# Patient Record
Sex: Female | Born: 1990 | Race: White | Hispanic: No | Marital: Single | Smoking: Never smoker
Health system: Southern US, Community
[De-identification: ages and names within clinical notes are randomized; demographics above are authoritative.]

## PROBLEM LIST (undated history)

## (undated) HISTORY — PX: WISDOM TOOTH EXTRACTION: SHX21

---

## 2013-12-11 ENCOUNTER — Encounter (INDEPENDENT_AMBULATORY_CARE_PROVIDER_SITE_OTHER): Payer: Self-pay

## 2013-12-11 ENCOUNTER — Encounter: Payer: Self-pay | Admitting: Family Medicine

## 2013-12-11 ENCOUNTER — Ambulatory Visit (INDEPENDENT_AMBULATORY_CARE_PROVIDER_SITE_OTHER): Payer: BC Managed Care – PPO | Admitting: Family Medicine

## 2013-12-11 VITALS — BP 110/82 | Temp 98.7°F | Ht 67.0 in | Wt 145.0 lb

## 2013-12-11 DIAGNOSIS — L989 Disorder of the skin and subcutaneous tissue, unspecified: Secondary | ICD-10-CM

## 2013-12-11 DIAGNOSIS — F518 Other sleep disorders not due to a substance or known physiological condition: Secondary | ICD-10-CM

## 2013-12-11 DIAGNOSIS — Z7189 Other specified counseling: Secondary | ICD-10-CM

## 2013-12-11 DIAGNOSIS — Z7689 Persons encountering health services in other specified circumstances: Secondary | ICD-10-CM

## 2013-12-11 DIAGNOSIS — IMO0002 Reserved for concepts with insufficient information to code with codable children: Secondary | ICD-10-CM

## 2013-12-11 NOTE — Progress Notes (Signed)
Pre visit review using our clinic review tool, if applicable. No additional management support is needed unless otherwise documented below in the visit note. 

## 2013-12-11 NOTE — Patient Instructions (Signed)
-  PLEASE SIGN UP FOR MYCHART TODAY   We recommend the following healthy lifestyle measures: - eat a healthy diet consisting of lots of vegetables, fruits, beans, nuts, seeds, healthy meats such as white chicken and fish and whole grains.  - avoid fried foods, fast food, processed foods, sodas, red meet and other fattening foods.  - get a least 150 minutes of aerobic exercise per week.   Follow up in: for CPE at you convenience and as needed

## 2013-12-11 NOTE — Progress Notes (Signed)
Chief Complaint  Patient presents with  . Establish Care  . mole concern  . dizzy spell    HPI:  Audrey Parker is here to establish care.  Last PCP and physical:  Has the following chronic problems and concerns today:  There are no active problems to display for this patient.  Dizzy spell: -very rarely for many years since she was a child, 1-2 times per year -  lasts for a few seconds and then feels fine - is triggered by a dream she had as a child and these images play in her mind and then she feels like she is dizzy -has not occurred in almost a year  Mole: -on abdomen -has had this as long as she can remember, have not changed -uncle had melanoma  Health Maintenance: -has never had pap smear  ROS: See pertinent positives and negatives per HPI.  History reviewed. No pertinent past medical history.  Family History  Problem Relation Age of Onset  . Cancer Maternal Uncle     melanoma  . COPD Maternal Grandfather   . Mental illness Paternal Grandmother     History   Social History  . Marital Status: Single    Spouse Name: N/A    Number of Children: N/A  . Years of Education: N/A   Social History Main Topics  . Smoking status: Never Smoker   . Smokeless tobacco: None  . Alcohol Use: No  . Drug Use: No  . Sexual Activity: No     Comment: denies ever being sexually active   Other Topics Concern  . 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  EXAM:  Filed Vitals:   12/11/13 0933  BP: 110/82  Temp: 98.7 F (37.1 C)    Body mass index is 22.71 kg/(m^2).  GENERAL: vitals reviewed and listed above, alert, oriented, appears well hydrated and in no acute distress  HEENT: atraumatic, conjunttiva clear, no obvious  abnormalities on inspection of external nose and ears  NECK: no obvious masses on inspection  LUNGS: clear to auscultation bilaterally, no wheezes, rales or rhonchi, good air movement  CV: HRRR, no peripheral edema  SKIN: SK on L abdomen  MS/NEURO: moves all extremities without noticeable abnormality Normal gait PERRLA CN II-XII grossly intact Finger to nose normal  PSYCH: pleasant and cooperative, no obvious depression or anxiety  ASSESSMENT AND PLAN:  Discussed the following assessment and plan:  Encounter to establish care  Abnormal dreams -has not occurred in abut 1 year and is less frequent, discussed etiologies and she wants to observe and if occurs again may see neurologist  Skin lesion Desires removal and she will call dermatologist for this given awkward location  -We reviewed the PMH, PSH, FH, SH, Meds and Allergies. -We provided refills for any medications we will prescribe as needed. -We addressed current concerns per orders and patient instructions. -We have asked for records for pertinent exams, studies, vaccines and notes from previous providers. -We have advised patient to follow up per instructions below.   -Patient advised to return or notify a doctor immediately if symptoms worsen or persist or new concerns arise.  Patient Instructions  -PLEASE  SIGN UP FOR MYCHART TODAY   We recommend the following healthy lifestyle measures: - eat a healthy diet consisting of lots of vegetables, fruits, beans, nuts, seeds, healthy meats such as white chicken and fish and whole grains.  - avoid fried foods, fast food, processed foods, sodas, red meet and other fattening foods.  - get a least 150 minutes of aerobic exercise per week.   Follow up in: for CPE at you convenience and as needed      Sabriah Hobbins, Jarrett Soho R.

## 2014-02-12 ENCOUNTER — Other Ambulatory Visit (HOSPITAL_COMMUNITY)
Admission: RE | Admit: 2014-02-12 | Discharge: 2014-02-12 | Disposition: A | Discharge status: Home / Self Care | Payer: BC Managed Care – PPO | Source: Ambulatory Visit | Attending: Family Medicine | Admitting: Family Medicine

## 2014-02-12 ENCOUNTER — Ambulatory Visit (INDEPENDENT_AMBULATORY_CARE_PROVIDER_SITE_OTHER): Payer: BC Managed Care – PPO | Admitting: Family Medicine

## 2014-02-12 ENCOUNTER — Encounter: Payer: Self-pay | Admitting: Family Medicine

## 2014-02-12 VITALS — BP 108/80 | Temp 99.1°F | Ht 67.0 in | Wt 144.0 lb

## 2014-02-12 DIAGNOSIS — Z01419 Encounter for gynecological examination (general) (routine) without abnormal findings: Secondary | ICD-10-CM | POA: Insufficient documentation

## 2014-02-12 DIAGNOSIS — Z23 Encounter for immunization: Secondary | ICD-10-CM

## 2014-02-12 DIAGNOSIS — Z0000 Encounter for general adult medical examination without abnormal findings: Secondary | ICD-10-CM

## 2014-02-12 NOTE — Progress Notes (Signed)
Chief Complaint  Patient presents with  . Annual Exam    HPI:  Here for CPE:  -Concerns today: none  -Diet: variety of foods, balance and well rounded  -Taking folic acid, Vit D, calcium: no  -Exercise: no regular exercise  -Diabetes and Dyslipidemia Screening: N/A  -Hx of HTN: no  -Vaccines: thinks had tetanus but not tdap 8 years ago  -pap history: never done  -FDLMP: 2 weeks ago, regular, normal  -sexual activity: no  -wants STI testing: no  -FH breast, colon or ovarian ca: see FH  -Alcohol, Tobacco, drug use: see social history  Review of Systems - Review of Systems  Constitutional: Negative for weight loss.  HENT: Negative for ear pain and hearing loss.   Eyes: Negative for blurred vision and double vision.  Respiratory: Negative for cough, shortness of breath and wheezing.   Cardiovascular: Negative for chest pain.  Gastrointestinal: Negative for vomiting, diarrhea, blood in stool and melena.  Genitourinary: Negative for dysuria and urgency.  Musculoskeletal: Negative for falls.  Skin: Negative for rash.  Neurological: Positive for headaches. Negative for dizziness, focal weakness and weakness.  Endo/Heme/Allergies: Does not bruise/bleed easily.  Psychiatric/Behavioral: Negative for depression and memory loss.   Migraines 1-2 times per year for many years, sometimes with aura.  No past medical history on file.  Past Surgical History  Procedure Laterality Date  . Wisdom tooth extraction      Family History  Problem Relation Age of Onset  . Cancer Maternal Uncle     melanoma  . COPD Maternal Grandfather   . Mental illness Paternal Grandmother     History   Social History  . Marital Status: Single    Spouse Name: N/A    Number of Children: N/A  . Years of Education: N/A   Social History Main Topics  . Smoking status: Never Smoker   . Smokeless tobacco: None  . Alcohol Use: No  . Drug Use: No  . Sexual Activity: No     Comment: denies  ever being sexually active   Other Topics Concern  . 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 moist mucus membranes.  NECK: supple, no masses or lymphadenopathy  LUNGS: clear to auscultation bilaterally, no rales, rhonchi or wheeze  CV: HRRR, no peripheral edema or cyanosis, normal pedal pulses  BREAST: normal appearance - no lesions or discharge, on palpation normal breast tissue without any suspicious masses  ABDOMEN: bowel sounds normal, soft, non tender to palpation, no masses, no rebound or guarding  GU: normal appearance of external genitalia - no lesions or masses, normal vaginal mucosa - no abnormal discharge, normal appearance of cervix - no lesions or abnormal discharge, she is very very tense and opening speculum was quite difficult.  RECTAL: declined  SKIN: no rash or abnormal lesions  MS:  normal gait, moves all extremities normally  NEURO: CN II-XII grossly intact, normal muscle strength and sensation to light touch on extremities  PSYCH: normal affect, pleasant and cooperative  ASSESSMENT AND PLAN:  Discussed the following assessment and plan:  Routine general medical examination at a health care facility - Plan: Cytology - PAP  Need for prophylactic vaccination with combined diphtheria-tetanus-pertussis (DTP) vaccine - Plan: Tdap vaccine greater than or equal to 7yo  IM  -Discussed and advised all [place] preventive services health task force level A and B recommendations for age, sex and risks.  -Advised at least 150 minutes of exercise per week and a healthy diet low in saturated fats and sweets and consisting of fresh fruits and vegetables, lean meats such as fish and white chicken and whole grains.  -Pap today  -tdap today  -labs, studies and vaccines per orders this encounter  Orders Placed This Encounter  Procedures  . Tdap vaccine greater than or equal to 7yo IM    Patient Instructions  -Vit D 1000 IU daily; 1200mg  total from diet and supplement; folic acid  -We have ordered labs or studies at this visit. It can take up to 1-2 weeks for results and processing. We will contact you with instructions IF your results are abnormal. Normal results will be released to your [facility]. If you have not heard from [place] or can not find your results in [facility] in 2 weeks please contact our office.  -let me know if you decide to see the neurologist about your migraines  -PLEASE SIGN UP FOR MYCHART TODAY   We recommend the following healthy lifestyle measures: - eat a healthy diet consisting of lots of vegetables, fruits, beans, nuts, seeds, healthy meats such as white chicken and fish and whole grains.  - avoid fried foods, fast food, processed foods, sodas, red meet and other fattening foods.  - get a least 150 minutes of aerobic exercise per week.   Follow up in: 1 year or as needed     Patient advised to return to clinic immediately if symptoms worsen or persist or new concerns.   Return in about 1 year (around 02/13/2015), or if symptoms worsen or fail to improve.  [name].

## 2014-02-12 NOTE — Patient Instructions (Signed)
-  Vit D 1000 IU daily; 1200mg  total from diet and supplement; folic acid  -We have ordered labs or studies at this visit. It can take up to 1-2 weeks for results and processing. We will contact you with instructions IF your results are abnormal. Normal results will be released to your [facility]. If you have not heard from [place] or can not find your results in [facility] in 2 weeks please contact our office.  -let me know if you decide to see the neurologist about your migraines  -PLEASE SIGN UP FOR MYCHART TODAY   We recommend the following healthy lifestyle measures: - eat a healthy diet consisting of lots of vegetables, fruits, beans, nuts, seeds, healthy meats such as white chicken and fish and whole grains.  - avoid fried foods, fast food, processed foods, sodas, red meet and other fattening foods.  - get a least 150 minutes of aerobic exercise per week.   Follow up in: 1 year or as needed

## 2014-02-12 NOTE — Progress Notes (Signed)
Pre visit review using our clinic review tool, if applicable. No additional management support is needed unless otherwise documented below in the visit note. 

## 2017-08-04 ENCOUNTER — Encounter: Payer: Self-pay | Admitting: Family Medicine

## 2017-11-24 ENCOUNTER — Encounter: Payer: Self-pay | Admitting: Family Medicine

## 2020-12-02 ENCOUNTER — Ambulatory Visit: Payer: Self-pay | Admitting: Obstetrics & Gynecology

## 2020-12-17 ENCOUNTER — Ambulatory Visit (INDEPENDENT_AMBULATORY_CARE_PROVIDER_SITE_OTHER): Payer: 59 | Admitting: Obstetrics & Gynecology

## 2020-12-17 ENCOUNTER — Other Ambulatory Visit: Payer: Self-pay

## 2020-12-17 ENCOUNTER — Encounter: Payer: Self-pay | Admitting: Obstetrics & Gynecology

## 2020-12-17 ENCOUNTER — Other Ambulatory Visit (HOSPITAL_COMMUNITY)
Admission: RE | Admit: 2020-12-17 | Discharge: 2020-12-17 | Disposition: A | Discharge status: Home / Self Care | Payer: 59 | Source: Ambulatory Visit | Attending: Obstetrics & Gynecology | Admitting: Obstetrics & Gynecology

## 2020-12-17 VITALS — BP 124/90 | HR 108 | Ht 67.0 in | Wt 148.0 lb

## 2020-12-17 DIAGNOSIS — Z01419 Encounter for gynecological examination (general) (routine) without abnormal findings: Secondary | ICD-10-CM | POA: Insufficient documentation

## 2020-12-17 DIAGNOSIS — N852 Hypertrophy of uterus: Secondary | ICD-10-CM

## 2020-12-17 NOTE — Progress Notes (Signed)
Patient ID: Audrey Parker, female   DOB: [birth year], 30 y.o.   MRN: [id number]  Chief Complaint  Patient presents with  . New Patient (Initial Visit)  check for fibroids  HPI Triston A Frater is a 30 y.o. female.  G0P0000 Patient's last menstrual period was 11/30/2020. Patient has never had sex. She is referred to evaluate lower abdominal bloating and cramps and some heavy flow with menses. She says her Dr. Suspected fibroids when she was examined in October. Last pap 5-6 yr ago.  HPI  History reviewed. No pertinent past medical history.  Past Surgical History:  Procedure Laterality Date  . WISDOM TOOTH EXTRACTION      Family History  Problem Relation Age of Onset  . Cancer Maternal Uncle        melanoma  . COPD Maternal Grandfather   . Mental illness Paternal Grandmother     Social History Social History   Tobacco Use  . Smoking status: Never Smoker  . Smokeless tobacco: Never Used  Substance Use Topics  . Alcohol use: No  . Drug use: No    No Known Allergies  Current Outpatient Medications  Medication Sig Dispense Refill  . Pediatric Multivit-Minerals-C (FLINTSTONES COMPLETE PO) Take by mouth daily.     No current facility-administered medications for this visit.    Review of Systems Review of Systems  Constitutional: Negative.   Respiratory: Negative.   Gastrointestinal: Positive for abdominal distention (at menses), constipation and nausea (associated with menses).  Genitourinary: Positive for menstrual problem and pelvic pain (cramps). Negative for dysuria.    Blood pressure 124/90, pulse (!) 108, height 5\' 7"  (1.702 m), weight 148 lb (67.1 kg), last menstrual period 11/30/2020.  Physical Exam Physical Exam Vitals and nursing note reviewed. Exam conducted with a chaperone present.  Constitutional:      Appearance: Normal appearance.  Cardiovascular:     Rate and Rhythm: Normal rate.  Pulmonary:     Effort: Pulmonary effort is normal.  Chest:   Breasts:     Tanner Score is 5.     Right: Normal. No mass or axillary adenopathy.     Left: Normal. No mass or axillary adenopathy.    Abdominal:     Palpations: Abdomen is soft. There is mass (low midline).  Genitourinary:    General: Normal vulva.     Vagina: Normal.     Cervix: Normal.     Uterus: Enlarged (12-14 weeks).      Adnexa: Right adnexa normal and left adnexa normal.  Lymphadenopathy:     Upper Body:     Right upper body: No axillary adenopathy.     Left upper body: No axillary adenopathy.  Neurological:     Mental Status: She is alert.     Data Reviewed Pap 2015  Assessment Enlarged uterus - Plan: US PELVIC COMPLETE WITH TRANSVAGINAL  Well woman exam with routine gynecological exam - Plan: Cytology - PAP( [facility])    Plan Orders Placed This Encounter  Procedures  . US PELVIC COMPLETE WITH TRANSVAGINAL    Standing Status:   Future    Standing Expiration Date:   12/17/2021    Order Specific Question:   Reason for Exam (SYMPTOM  OR DIAGNOSIS REQUIRED)    Answer:   enlarged uterus    Order Specific Question:   Preferred imaging location?    Answer:   [facility]    Order Specific Question:   Release to patient    Answer:  Immediate   Will f/u after [place] Take naproxen for menstrual sx    Emeterio Reeve 12/17/2020, 9:35 AM

## 2020-12-17 NOTE — Patient Instructions (Signed)
Dysmenorrhea Dysmenorrhea means cramps during your period (menstrual period) that cause pain in your lower belly (abdomen). The pain is caused by the tightening (contracting) of the muscles of the womb (uterus). The pain may be mild or very bad. Primary dysmenorrhea is cramps that last a couple of days when a woman starts having periods or soon after. As a woman gets older or has a baby, the cramps will usually lessen or disappear. Secondary dysmenorrhea begins later in life and is caused by other problems. What are the causes? This condition may be caused by problems with the:  Tissue that lines the womb. This tissue may grow: ? Outside of the womb. ? Into the walls of the womb.  Blood vessels in the area between your hip bones (pelvis).  Tissue in the lower part of the womb (cervix), including growths (polyps).  Muscles that hold up the womb.  Bladder.  Bowels. It can also be caused by cancer. Other causes include:  A very tipped womb.  The lower part of the womb having a small opening.  Tumors in the womb that are not cancer.  Pelvic inflammatory disease (PID).  Scars from surgeries you have had.  A cyst in the ovaries.  An IUD (intrauterine device). What increases the risk?  Being younger than age 82.  Having started puberty early.  Having irregular bleeding or heavy bleeding.  Never having given birth.  Having a family history of period cramps.  Smoking or using products with nicotine.  Having a high body weight or a low body weight. What are the signs or symptoms?  Cramps and pain in the lower belly or lower back.  A feeling of fullness in the lower belly.  Periods lasting for longer than 7 days.  Headaches.  Bloating.  Tiredness (fatigue).  Feeling like you may vomit (nauseous) or vomiting.  Watery poop (diarrhea) or loose poop (stool).  Sweating.  Dizziness. How is this treated? Treatment depends on the cause of the cramps. Treatment may  include medicines, such as:  Medicines for pain.  Medicines for bleeding.  Body chemical (hormone) replacement therapy. ? Shots (injections) to stop the menstrual period. ? Birth control pills. ? An IUD.  NSAIDs, such as ibuprofen. Other treatments may include:  Surgeries.  Procedures.  Nerve stimulation.  Doing exercises.  Yoga and alternative treatments. Work with your doctor to find what treatments are best for you. Follow these instructions at home: Helping pain and cramping  If told, put heat on your lower back or belly when you have pain or cramps. Do this as often as told by your doctor. Use the heat source that your doctor recommends, such as a moist heat pack or a heating pad. ? Place a towel between your skin and the heat. ? Leave the heat on for 20-30 minutes. ? Take off the heat if your skin turns bright red. This is very important. If you cannot feel pain, heat, or cold, you have a greater risk of getting burned.  Do not sleep with a heating pad.  Exercise. Walking, swimming, or biking can help take away cramps.  Massage your lower back or belly. This may help lessen pain.   General instructions  Take over-the-counter and prescription medicines only as told by your doctor.  Ask your doctor if you should avoid driving or using machines while you are taking your medicine.  Avoid alcohol and caffeine during and right before your period. These can make cramps worse.  Do not  smoke or use any products that contain nicotine or tobacco. If you need help quitting, ask your doctor.  Keep all follow-up visits. Contact a doctor if:  You have pain that gets worse.  You have pain that does not get better with medicine.  You have pain during sex.  You feel like you may vomit or you vomit during your period and medicine does not help. Get help right away if:  You faint. Summary  Dysmenorrhea means painful cramps during your period.  Put heat on your lower  back or belly when you have pain or cramps.  Do exercises like walking, swimming, or biking to help with cramps.  Contact a doctor if you have pain during sex. This information is not intended to replace advice given to you by your health care provider. Make sure you discuss any questions you have with your health care provider. Document Revised: 06/18/2020 Document Reviewed: 06/18/2020 Elsevier Patient Education  2021 Elsevier Inc.  

## 2020-12-17 NOTE — Progress Notes (Signed)
Pt states it has been several yeas since she has had an gyn exam/pap.  Pt states she has history of fibroids and has some occ problems.

## 2020-12-19 LAB — CYTOLOGY - PAP
Adequacy: ABSENT
Diagnosis: NEGATIVE

## 2020-12-26 ENCOUNTER — Ambulatory Visit
Admission: RE | Admit: 2020-12-26 | Discharge: 2020-12-26 | Disposition: A | Discharge status: Home / Self Care | Payer: 59 | Source: Ambulatory Visit | Attending: Obstetrics & Gynecology | Admitting: Obstetrics & Gynecology

## 2020-12-26 DIAGNOSIS — N852 Hypertrophy of uterus: Secondary | ICD-10-CM

## 2020-12-29 ENCOUNTER — Other Ambulatory Visit: Payer: 59

## 2021-01-13 ENCOUNTER — Telehealth (INDEPENDENT_AMBULATORY_CARE_PROVIDER_SITE_OTHER): Payer: 59 | Admitting: Obstetrics & Gynecology

## 2021-01-13 DIAGNOSIS — D251 Intramural leiomyoma of uterus: Secondary | ICD-10-CM

## 2021-01-13 DIAGNOSIS — N852 Hypertrophy of uterus: Secondary | ICD-10-CM

## 2021-01-13 DIAGNOSIS — D259 Leiomyoma of uterus, unspecified: Secondary | ICD-10-CM | POA: Insufficient documentation

## 2021-01-13 NOTE — Progress Notes (Signed)
[facility] for [place] results.  Reports no problems today.

## 2021-01-13 NOTE — Progress Notes (Signed)
TELEHEALTH GYNECOLOGY VISIT ENCOUNTER NOTE  Provider location: [facility] [address]   I connected with Inez Catalina on 01/13/21 at  4:15 PM EST by telephone at home and verified that I am speaking with the correct person using two identifiers. Patient was unable to do MyChart audiovisual encounter due to technical difficulties, she tried several times.    I discussed the limitations, risks, security and privacy concerns of performing an evaluation and management service by telephone and the availability of in person appointments. I also discussed with the patient that there may be a patient responsible charge related to this service. The patient expressed understanding and agreed to proceed.   History:  ALONDA WEABER is a 30 y.o. G0P0000 female being evaluated today for enlarged fibroid which was confirmed to be due to a large dominant fibroid with [place] 12/26/20. She denies any abnormal vaginal discharge, bleeding, pelvic pain or other concerns.  Her last period was spotting only     No past medical history on file. Past Surgical History:  Procedure Laterality Date  . WISDOM TOOTH EXTRACTION     The following portions of the patient's history were reviewed and updated as appropriate: allergies, current medications, past family history, past medical history, past social history, past surgical history and problem list.   Health Maintenance:  Normal pap  12/17/20.    Review of Systems:  Pertinent items noted in HPI and remainder of comprehensive ROS otherwise negative.  Physical Exam:   General:  Alert, oriented and cooperative.   Mental Status: Normal mood and affect perceived. Normal judgment and thought content.  Physical exam deferred due to nature of the encounter  Labs and Imaging No results found for this or any previous visit (from the past 336 hour(s)). US PELVIC COMPLETE WITH TRANSVAGINAL  Result Date: 12/26/2020 CLINICAL DATA:  Uterine enlargement,  pelvic cramping, heavy menstrual bleeding. LMP 11/30/2020 EXAM: TRANSABDOMINAL AND TRANSVAGINAL ULTRASOUND OF PELVIS TECHNIQUE: Both transabdominal and transvaginal ultrasound examinations of the pelvis were performed. Transabdominal technique was performed for global imaging of the pelvis including uterus, ovaries, adnexal regions, and pelvic cul-de-sac. It was necessary to proceed with endovaginal exam following the transabdominal exam to visualize the endometrium. COMPARISON:  None FINDINGS: Uterus Measurements: 19 x 12 x 16 cm = volume: 1,894 mL. The uterus demonstrates relatively globular enlargement of the fundus. The myometrial echotexture is diffusely markedly, coarsened. Cine images deviation of the endometrial stripe laterally with the myometrial abnormality largely related to a single large intramural heterogeneously hypoechoic mass most in keeping with a dominant intramural fibroid measuring roughly 14.5 x 8.1 x 12.6 cm in size. Multiple nabothian cysts are seen within the cervix which is otherwise unremarkable though not specifically imaged on this examination. Endometrium Thickness: Best seen on transvaginal imaging measuring 8 mm in thickness. No focal abnormality visualized. Right ovary Not visualized.  No adnexal masses seen. Left ovary Measurements: 2.9 x 1.8 x 4.2 cm = volume: 12 mL. Normal appearance/no adnexal mass. Other findings No abnormal free fluid. IMPRESSION: Moderate uterine enlargement related to a dominant uterine fibroid measuring roughly 14.5 cm in greatest dimension. This could be better assessed with MRI examination, particularly if uterine artery embolization is to be considered as a therapeutic option. Nonvisualization of the right ovary Electronically Signed   By: Fidela Salisbury MD   On: 12/26/2020 15:31      Assessment and Plan:     1. Enlarged uterus Fibroid uterus with sx of pelvic mass.  She may be a candidate for [place] and we discussed this vs.surgical option. She will  consider whether to be referred to IR.       I discussed the assessment and treatment plan with the patient. The patient was provided an opportunity to ask questions and all were answered. The patient agreed with the plan and demonstrated an understanding of the instructions.   The patient was advised to call back or seek an in-person evaluation/go to the ED if the symptoms worsen or if the condition fails to improve as anticipated.  I provided 15 minutes of non-face-to-face time during this encounter.   Emeterio Reeve, MD [facility] [place], [place]

## 2021-01-13 NOTE — Patient Instructions (Signed)
Uterine Artery Embolization for Fibroids  Uterine artery embolization is a procedure to shrink uterine fibroids. Uterine fibroids are masses of tissue (tumors) that can develop in the womb (uterus). They are also called leiomyomas. This type of tumor is not cancerous (benign) and does not spread to other parts of the body outside of the pelvic area. The pelvic area is the part of the body between the hip bones. You can have one or many fibroids. Fibroids can vary in size, shape, weight, and where they grow in the uterus. Some can become quite large. In this procedure, a thin plastic tube (catheter) is used to inject a chemical that blocks off the blood supply to the fibroid, which causes the fibroid to shrink. Tell a health care provider about:  Any allergies you have.  All medicines you are taking, including vitamins, herbs, eye drops, creams, and over-the-counter medicines.  Any problems you or family members have had with anesthetic medicines.  Any blood disorders you have.  Any surgeries you have had.  Any medical conditions you have.  Whether you are pregnant or may be pregnant. What are the risks? Generally, this is a safe procedure. However, problems may occur, including:  Bleeding.  Allergic reactions to medicines or dyes.  Damage to other structures or organs.  Infection, including blood infection (septicemia).  Injury to the uterus from decreased blood supply.  Lack of menstrual periods (amenorrhea).  Death of tissue cells (necrosis) around your bladder or vulva.  Development of a hole between organs or from an organ to the surface of your skin (fistula).  Blood clot in the legs (deep vein thrombosis) or lung (pulmonary embolus).  Nausea and vomiting. What happens before the procedure? Staying hydrated Follow instructions from your health care provider about hydration, which may include:  Up to 2 hours before the procedure - you may continue to drink clear  liquids, such as water, clear fruit juice, black coffee, and plain tea. Eating and drinking restrictions Follow instructions from your health care provider about eating and drinking, which may include:  8 hours before the procedure - stop eating heavy meals or foods such as meat, fried foods, or fatty foods.  6 hours before the procedure - stop eating light meals or foods, such as toast or cereal.  6 hours before the procedure - stop drinking milk or drinks that contain milk.  2 hours before the procedure - stop drinking clear liquids. Medicines  Ask your health care provider about: ? Changing or stopping your regular medicines. This is especially important if you are taking diabetes medicines or blood thinners. ? Taking over-the-counter medicines, vitamins, herbs, and supplements. ? Taking medicines such as aspirin and ibuprofen. These medicines can thin your blood. Do not take these medicines unless your health care provider tells you to take them.  You may be given antibiotic medicine to help prevent infection.  You may be given medicine to prevent nausea and vomiting (antiemetic). General instructions  Ask your health care provider how your surgical site will be marked or identified.  You may be asked to shower with a germ-killing soap.  Plan to have someone take you home from the hospital or clinic.  If you will be going home right after the procedure, plan to have someone with you for 24 hours.  You will be asked to empty your bladder. What happens during the procedure?  To lower your risk of infection: ? Your health care team will wash or sanitize their hands. ?   Hair may be removed from the surgical area. ? Your skin will be washed with soap.  An IV will be inserted into one of your veins.  You will be given one or more of the following: ? A medicine to help you relax (sedative). ? A medicine to numb the area (local anesthetic).  A small cut (incision) will be made  in your groin.  A catheter will be inserted into the main artery of your leg. The catheter will be guided through the artery to your uterus.  A series of images will be taken while dye is injected through the catheter in your groin. X-rays are taken at the same time. This is done to provide a road map of the blood supply to your uterus and fibroids.  Tiny plastic spheres, about the size of sand grains, will be injected through the catheter. Metal coils may be used to help block the artery. The particles will lodge in tiny branches of the uterine artery that supplies blood to the fibroids.  The procedure will be repeated on the artery that supplies the other side of the uterus.  The catheter will be removed and pressure will be applied to stop the bleeding.  A dressing will be placed over the incision. The procedure may vary among health care providers and hospitals. What happens after the procedure?  Your blood pressure, heart rate, breathing rate, and blood oxygen level will be monitored until the medicines you were given have worn off.  You will be given pain medicine as needed.  You may be given medicine for nausea and vomiting as needed.  Do not drive for 24 hours if you were given a sedative. Summary  Uterine artery embolization is a procedure to shrink uterine fibroids by blocking the blood supply to the fibroid.  You may be given a sedative and local anesthetic for the procedure.  A catheter will be inserted into the main artery of your leg. The catheter will be guided through the artery to your uterus.  After the procedure you will be given pain medicine and medicine for nausea as needed.  Do not drive for 24 hours if you were given a sedative. This information is not intended to replace advice given to you by your health care provider. Make sure you discuss any questions you have with your health care provider. Document Revised: 10/14/2017 Document Reviewed:  02/03/2017 Elsevier Patient Education  2021 Elsevier Inc.   

## 2021-01-23 ENCOUNTER — Telehealth (INDEPENDENT_AMBULATORY_CARE_PROVIDER_SITE_OTHER): Payer: 59 | Admitting: Obstetrics & Gynecology

## 2021-01-23 ENCOUNTER — Encounter: Payer: Self-pay | Admitting: Obstetrics & Gynecology

## 2021-01-23 DIAGNOSIS — D251 Intramural leiomyoma of uterus: Secondary | ICD-10-CM | POA: Diagnosis not present

## 2021-01-23 MED ORDER — ONDANSETRON 4 MG PO TBDP: 4.0000 mg | ORAL_TABLET | Freq: Four times a day (QID) | ORAL | 0 refills | Status: DC | PRN

## 2021-01-23 NOTE — Patient Instructions (Signed)
Uterine Artery Embolization for Fibroids, Care After  This sheet gives you information about how to care for yourself after your procedure. Your health care provider may also give you more specific instructions. If you have problems or questions, contact your health care provider.  What can I expect after the procedure?  After your procedure, it is common to have:  · Pelvic cramping. You will be given pain medicine.  · Nausea and vomiting. You may be given medicine to help relieve nausea.  Follow these instructions at home:  Incision care  · Follow instructions from your health care provider about how to take care of your incision. Make sure you:  ? Wash your hands with soap and water before you change your bandage (dressing). If soap and water are not available, use hand sanitizer.  ? Change your dressing as told by your health care provider.  · Check your incision area every day for signs of infection. Check for:  ? More redness, swelling, or pain.  ? More fluid or blood.  ? Warmth.  ? Pus or a bad smell.  Medicines    · Take over-the-counter and prescription medicines only as told by your health care provider.  · Do not take aspirin. It can cause bleeding.  · Do not drive for 24 hours if you were given a medicine to help you relax (sedative).  · Do not drive or use heavy machinery while taking prescription pain medicine.  General instructions  · Ask your health care provider when you can resume sexual activity.  · To prevent or treat constipation while you are taking prescription pain medicine, your health care provider may recommend that you:  ? Drink enough fluid to keep your urine clear or pale yellow.  ? Take over-the-counter or prescription medicines.  ? Eat foods that are high in fiber, such as fresh fruits and vegetables, whole grains, and beans.  ? Limit foods that are high in fat and processed sugars, such as fried and sweet foods.  Contact a health care provider if:  · You have a fever.  · You have more  redness, swelling, or pain around your incision site.  · You have more fluid or blood coming from your incision site.  · Your incision feels warm to the touch.  · You have pus or a bad smell coming from your incision.  · You have a rash.  · You have uncontrolled nausea or you cannot eat or drink anything without vomiting.  Get help right away if:  · You have trouble breathing.  · You have chest pain.  · You have severe abdominal pain.  · You have leg pain.  · You become dizzy and faint.  Summary  · After your procedure, it is common to have pelvic cramping. You will be given pain medicine.  · Follow instructions from your health care provider about how to take care of your incision.  · Check your incision area every day for signs of infection.  · Take over-the-counter and prescription medicines only as told by your health care provider.  This information is not intended to replace advice given to you by your health care provider. Make sure you discuss any questions you have with your health care provider.  Document Revised: 10/14/2017 Document Reviewed: 02/03/2017  Elsevier Patient Education © 2021 Elsevier Inc.

## 2021-01-23 NOTE — Progress Notes (Signed)
TELEHEALTH GYNECOLOGY VISIT ENCOUNTER NOTE  Provider location: [facility] at [place]   I connected with Audrey Parker on 01/23/21 at 11:15 AM EST by telephone at home and verified that I am speaking with the correct person using two identifiers. Patient was unable to do MyChart audiovisual encounter due to technical difficulties, she tried several times.    I discussed the limitations, risks, security and privacy concerns of performing an evaluation and management service by telephone and the availability of in person appointments. I also discussed with the patient that there may be a patient responsible charge related to this service. The patient expressed understanding and agreed to proceed.   History:  Audrey Parker is a 30 y.o. [place] female being evaluated today for referral to IR for [place]. She reports uterine cramping and nausea.    No past medical history on file. Past Surgical History:  Procedure Laterality Date  . WISDOM TOOTH EXTRACTION     The following portions of the patient's history were reviewed and updated as appropriate: allergies, current medications, past family history, past medical history, past social history, past surgical history and problem list.   Health Maintenance:  Normal pap 12/17/2020.    Review of Systems:  Pertinent items noted in HPI and remainder of comprehensive ROS otherwise negative.  Physical Exam:   General:  Alert, oriented and cooperative.   Mental Status: Normal mood and affect perceived. Normal judgment and thought content.  Physical exam deferred due to nature of the encounter  Labs and Imaging No results found for this or any previous visit (from the past 336 hour(s)). US PELVIC COMPLETE WITH TRANSVAGINAL  Result Date: 12/26/2020 CLINICAL DATA:  Uterine enlargement, pelvic cramping, heavy menstrual bleeding. LMP 11/30/2020 EXAM: TRANSABDOMINAL AND TRANSVAGINAL ULTRASOUND OF PELVIS TECHNIQUE: Both transabdominal  and transvaginal ultrasound examinations of the pelvis were performed. Transabdominal technique was performed for global imaging of the pelvis including uterus, ovaries, adnexal regions, and pelvic cul-de-sac. It was necessary to proceed with endovaginal exam following the transabdominal exam to visualize the endometrium. COMPARISON:  None FINDINGS: Uterus Measurements: 19 x 12 x 16 cm = volume: 1,894 mL. The uterus demonstrates relatively globular enlargement of the fundus. The myometrial echotexture is diffusely markedly, coarsened. Cine images deviation of the endometrial stripe laterally with the myometrial abnormality largely related to a single large intramural heterogeneously hypoechoic mass most in keeping with a dominant intramural fibroid measuring roughly 14.5 x 8.1 x 12.6 cm in size. Multiple nabothian cysts are seen within the cervix which is otherwise unremarkable though not specifically imaged on this examination. Endometrium Thickness: Best seen on transvaginal imaging measuring 8 mm in thickness. No focal abnormality visualized. Right ovary Not visualized.  No adnexal masses seen. Left ovary Measurements: 2.9 x 1.8 x 4.2 cm = volume: 12 mL. Normal appearance/no adnexal mass. Other findings No abnormal free fluid. IMPRESSION: Moderate uterine enlargement related to a dominant uterine fibroid measuring roughly 14.5 cm in greatest dimension. This could be better assessed with MRI examination, particularly if uterine artery embolization is to be considered as a therapeutic option. Nonvisualization of the right ovary Electronically Signed   By: Fidela Salisbury MD   On: 12/26/2020 15:31      Assessment and Plan:     1. Intramural leiomyoma of uterus Rx for nausea. IR requires MRI of pelvis  - ondansetron (ZOFRAN ODT) 4 MG disintegrating tablet; Take 1 tablet (4 mg total) by mouth every 6 (six) hours as needed for  nausea.  Dispense: 20 tablet; Refill: 0 - MR PELVIS W WO CONTRAST; Future - Ambulatory  referral to Interventional Radiology       I discussed the assessment and treatment plan with the patient. The patient was provided an opportunity to ask questions and all were answered. The patient agreed with the plan and demonstrated an understanding of the instructions.   The patient was advised to call back or seek an in-person evaluation/go to the ED if the symptoms worsen or if the condition fails to improve as anticipated. Orders Placed This Encounter  Procedures  . MR PELVIS W WO CONTRAST    Standing Status:   Future    Standing Expiration Date:   01/23/2022    Order Specific Question:   If indicated for the ordered procedure, I authorize the administration of contrast media per Radiology protocol    Answer:   Yes    Order Specific Question:   What is the patient's sedation requirement?    Answer:   No Sedation    Order Specific Question:   Does the patient have a pacemaker or implanted devices?    Answer:   No    Order Specific Question:   Preferred imaging location?    Answer:   [facility] (table limit - 500 lbs)  . Ambulatory referral to Interventional Radiology    Referral Priority:   Routine    Referral Type:   Consultation    Referral Reason:   Specialty Services Required    Requested Specialty:   Interventional Radiology    Number of Visits Requested:   1    I provided 12 minutes of non-face-to-face time during this encounter.   Emeterio Reeve, MD [facility] [place], [place]

## 2021-01-23 NOTE — Progress Notes (Signed)
I connected with Audrey Parker on 01/24/11 by telephone and verified that I am speaking with the correct person using two identifiers. Pt is connecting virtually to discuss uterine embolization.   Patient: Home Provider: [place]

## 2021-02-09 ENCOUNTER — Ambulatory Visit (HOSPITAL_COMMUNITY)
Admission: RE | Admit: 2021-02-09 | Discharge: 2021-02-09 | Disposition: A | Discharge status: Home / Self Care | Payer: 59 | Source: Ambulatory Visit | Attending: Obstetrics & Gynecology | Admitting: Obstetrics & Gynecology

## 2021-02-09 ENCOUNTER — Other Ambulatory Visit: Payer: Self-pay

## 2021-02-09 DIAGNOSIS — D251 Intramural leiomyoma of uterus: Secondary | ICD-10-CM | POA: Insufficient documentation

## 2021-02-09 MED ORDER — GADOBUTROL 1 MMOL/ML IV SOLN
6.5000 mL | Freq: Once | INTRAVENOUS | Status: AC | PRN
  Administered 2021-02-09: 6.5 mL via INTRAVENOUS

## 2022-01-10 IMAGING — MR MR PELVIS WO/W CM
14 of 20 series · 31 of 48 positions shown · IV contrast (6.5 gv)
Comparison: None.

CLINICAL DATA: Symptomatic uterine fibroids.  Treatment planning.

EXAM:
MRI PELVIS WITHOUT AND WITH CONTRAST
TECHNIQUE: Multiplanar multisequence MR imaging of the pelvis was performed
both before and after administration of intravenous contrast.
CONTRAST:  6.5mL GADAVIST GADOBUTROL 1 MMOL/ML IV SOLN

[Series 2: cor ssfse overview · coronal · 6.0mm · 0.78mm/px · 1 of 31 slices shown]
[im 1/31]
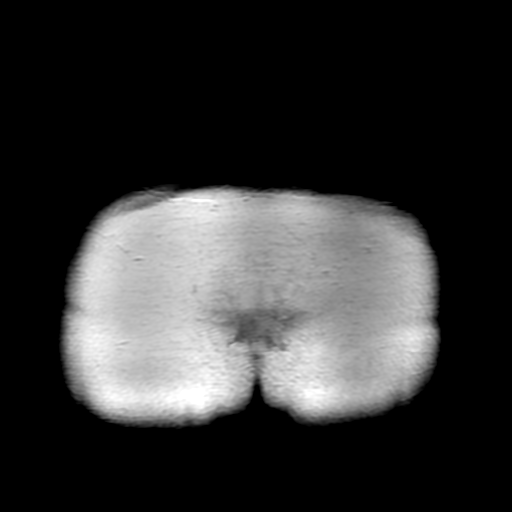

[Series 3: T2 · sagittal · 5.0mm · 0.47mm/px · 1 of 32 slices shown (1 of 3)]
[im 1/32]
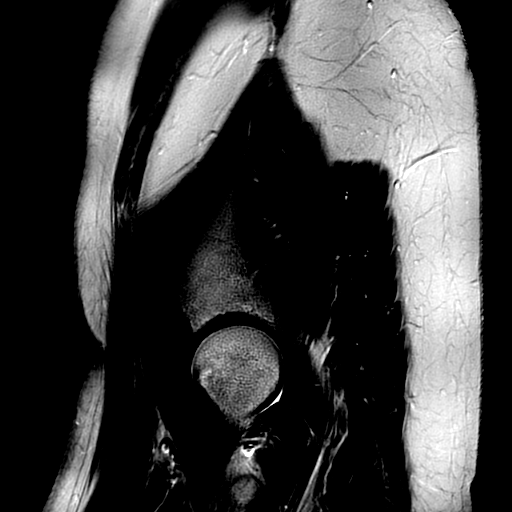

[Series 4: T2 · axial · 5.0mm · 0.51mm/px · 1 of 40 slices shown (2 of 3)]
[im 1/40]
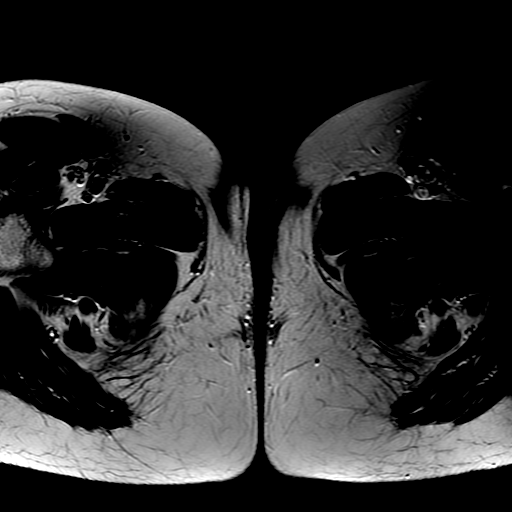

[Series 5: T2 fat-sat · axial · 5.0mm · 0.51mm/px · 1 of 40 slices shown]
[im 1/40]
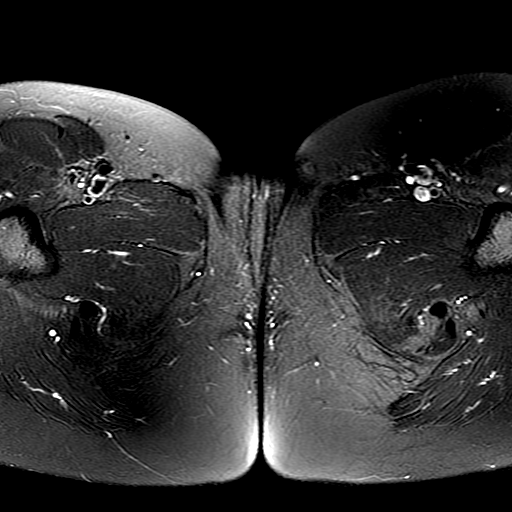

[Series 6: T2 · coronal · 5.0mm · 0.47mm/px · 2 of 38 slices shown (3 of 3)]
[im 1/38]
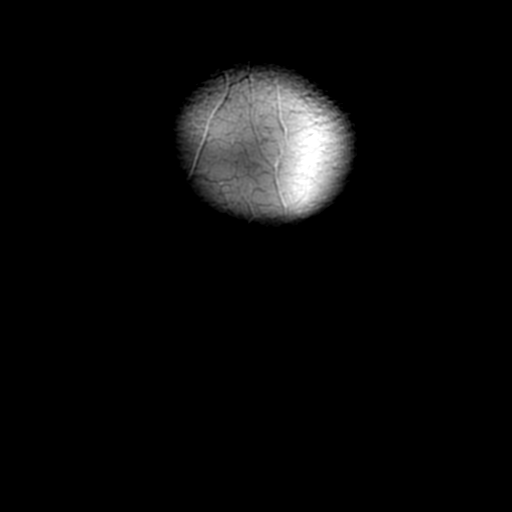
[im 38/38]
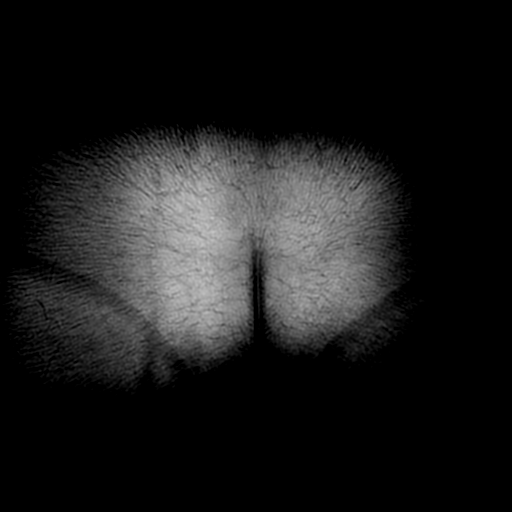

[Series 7: DWI · axial · 4.0mm · 0.94mm/px · z∈[-80,+160]mm · 4 of 98 slices shown]
[im 1/98]
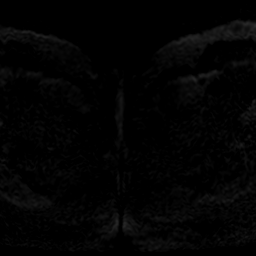
[im 33/98]
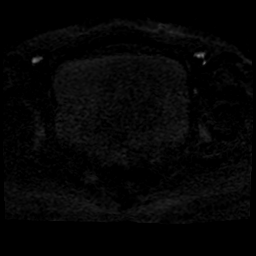
[im 65/98]
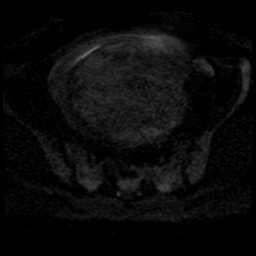
[im 98/98]
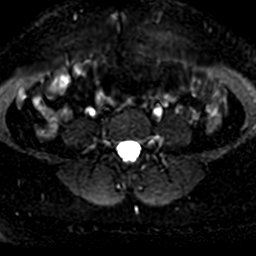

[Series 9: T1 dynamic · axial · 5.0mm · 1.09mm/px · z∈[-83,+164]mm · 4 of 100 slices shown (1 of 4)]
[im 1/100]
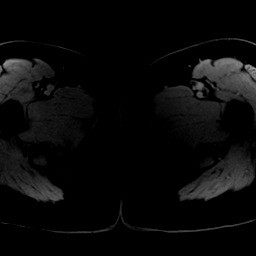
[im 34/100]
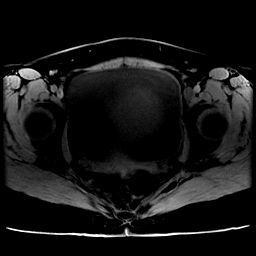
[im 67/100]
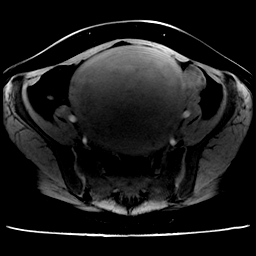
[im 100/100]
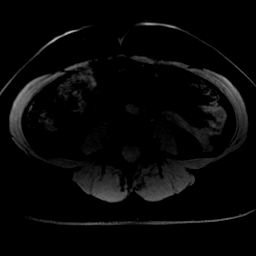

[Series 14: T1 fat-sat · axial · 4.5mm · 0.62mm/px · z∈[-81,+161]mm · 2 of 45 slices shown (1 of 2)]
[im 1/45]
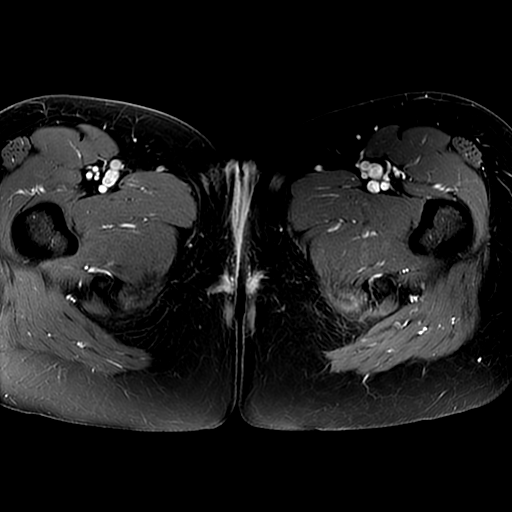
[im 45/45]
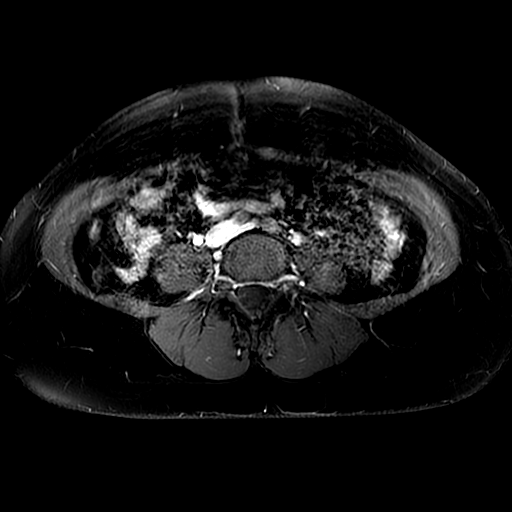

[Series 15: T1 fat-sat · sagittal · 5.0mm · 0.47mm/px · 1 of 32 slices shown (2 of 2)]
[im 1/32]
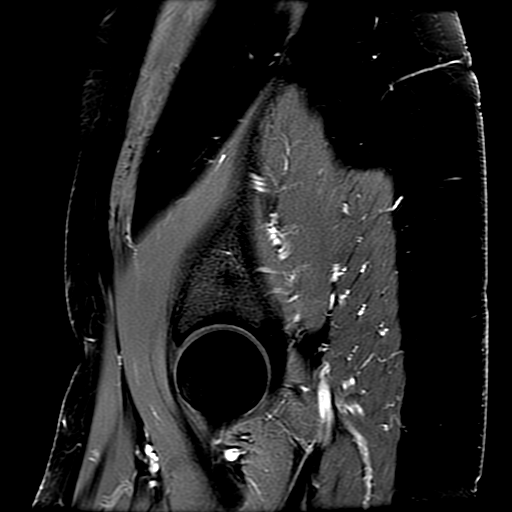

[Series 16: T2 post-contrast · sagittal · 5.0mm · 0.47mm/px · 1 of 32 slices shown]
[im 1/32]
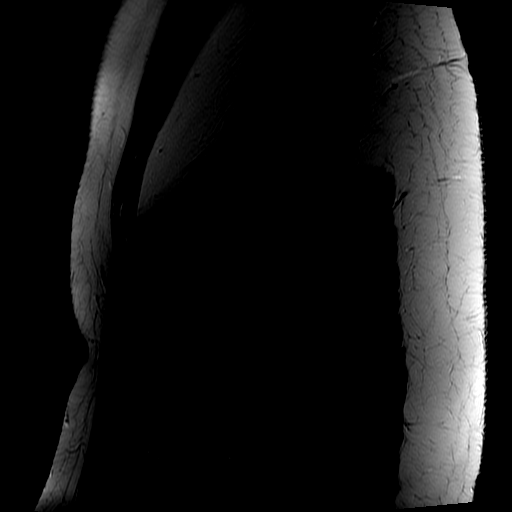

[Series 750: ADC · axial · 4.0mm · 0.94mm/px · z∈[-80,+160]mm · 2 of 49 slices shown]
[im 1/49]
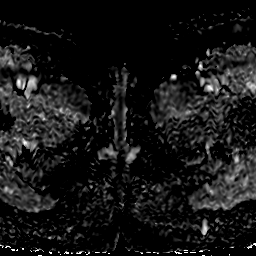
[im 49/49]
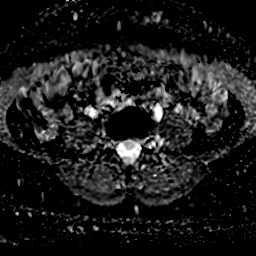

[Series 901: T1 dynamic · axial · 5.0mm · 1.09mm/px · z∈[-83,+164]mm · 4 of 100 slices shown (2 of 4)]
[im 1/100]
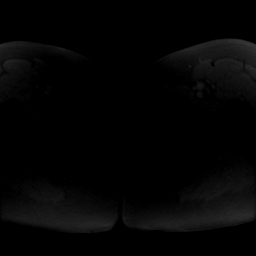
[im 34/100]
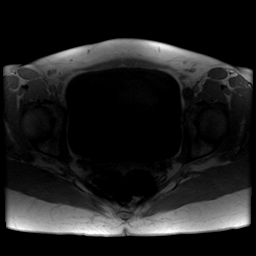
[im 67/100]
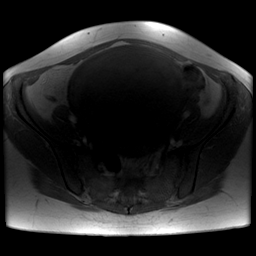
[im 100/100]
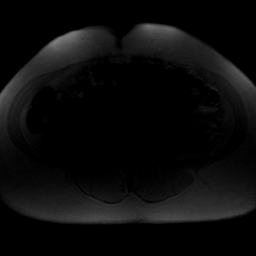

[Series 902: T1 dynamic · axial · 5.0mm · 1.09mm/px · z∈[-83,+164]mm · 4 of 100 slices shown (3 of 4)]
[im 1/100]
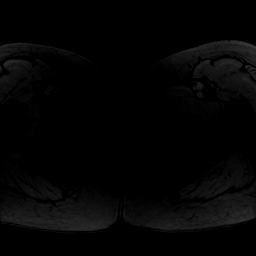
[im 34/100]
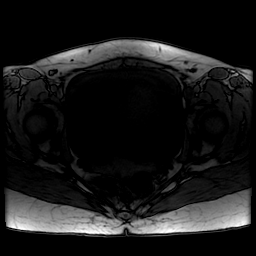
[im 67/100]
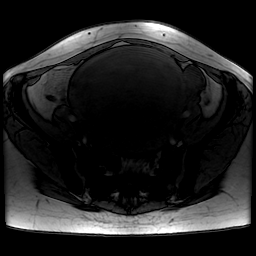
[im 100/100]
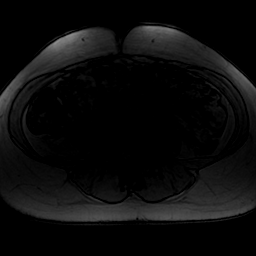

[Series 1300: T1 dynamic · axial · 4.0mm · 0.56mm/px · z∈[-86,+166]mm · 3 of 64 slices shown (4 of 4)]
[im 1/64]
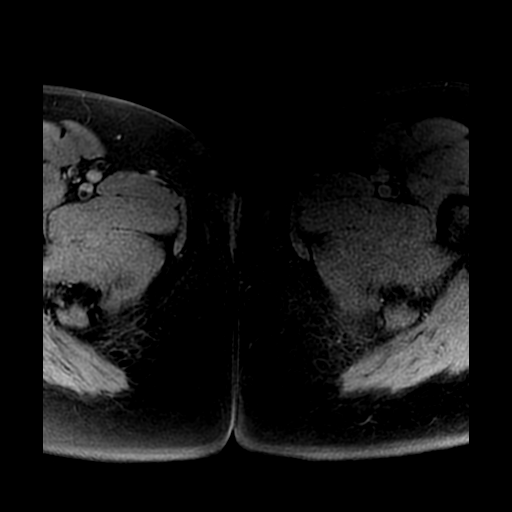
[im 32/64]
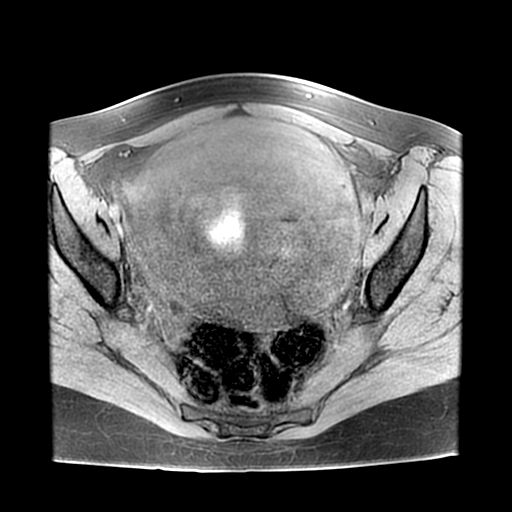
[im 64/64]
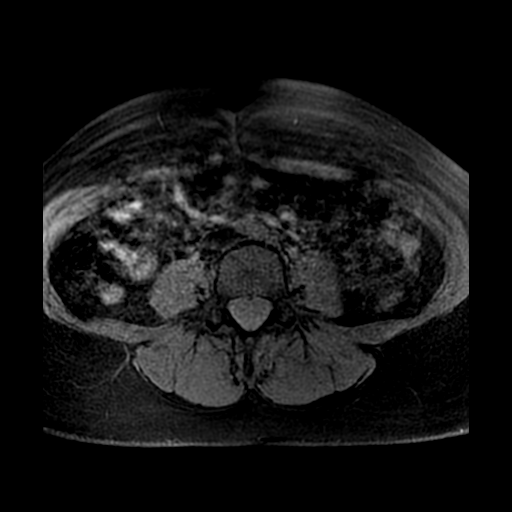

[31 of 48 positions shown; findings below may reference images not displayed]

FINDINGS: Lower Urinary Tract: No bladder or urethral abnormality identified.

Bowel:  Unremarkable visualized pelvic bowel loops.

Vascular/Lymphatic: No pathologically enlarged lymph nodes or other
significant abnormality.

Reproductive:

-- Uterus: Measures 17.2 x 11.7 x 13.9 cm (volume = 0264 cm^3). A
single central uterine fibroid is seen which displaces the
endometrial cavity to the right. This is intramural and submucosal
in location, measuring 13.4 x 10.3 x 11.7 cm. No other uterine
fibroids identified.

-- Intracavitary fibroids:  None.

-- Pedunculated fibroids: None.

-- Fibroid contrast enhancement: The fibroid shows diffuse,
heterogeneous contrast enhancement, with some small central areas of
degeneration.

-- Right ovary:  Appears normal.  No mass identified.

-- Left ovary:  Appears normal.  No mass identified.

Other: No abnormal free fluid.

Musculoskeletal:  Unremarkable.
IMPRESSION: Single 13.4 cm central uterine fibroid, which is intramural and
submucosal in location. No intracavitary or pedunculated fibroids
identified.

Normal appearance of both ovaries. No adnexal mass identified.

## 2022-12-01 ENCOUNTER — Encounter: Payer: Self-pay | Admitting: Obstetrics & Gynecology

## 2022-12-01 ENCOUNTER — Ambulatory Visit (INDEPENDENT_AMBULATORY_CARE_PROVIDER_SITE_OTHER): Payer: BLUE CROSS/BLUE SHIELD | Admitting: Obstetrics & Gynecology

## 2022-12-01 VITALS — BP 128/88 | HR 106 | Ht 67.0 in | Wt 170.0 lb

## 2022-12-01 DIAGNOSIS — N852 Hypertrophy of uterus: Secondary | ICD-10-CM | POA: Diagnosis not present

## 2022-12-01 DIAGNOSIS — D251 Intramural leiomyoma of uterus: Secondary | ICD-10-CM

## 2022-12-01 NOTE — Progress Notes (Addendum)
32 y.o GYN presents for heavy bleeding, bloating, nausea and management of Fibroids x 3+ years, spotting in between periods.  Last PAP 12/17/2020

## 2022-12-01 NOTE — Progress Notes (Signed)
Patient ID: Audrey Parker, female   DOB: 06/20/1991, 32 y.o.   MRN: [id number]  Chief Complaint  Patient presents with   Follow-up    HPI Audrey Parker is a 32 y.o. female.  [id number] Patient's last menstrual period was 11/11/2022 (exact date). She has a 13 cm dominant fibroid and she has seen Dr. Willis Modena as well in consult as well as [place] and MRI. She wants to proceed with treatment but would consider [place] if she is a candidate or LS/robotic surgery. Her current insurance is through [facility] so we are not in network. HPI  No past medical history on file.  Past Surgical History:  Procedure Laterality Date   WISDOM TOOTH EXTRACTION      Family History  Problem Relation Age of Onset   Cancer Maternal Uncle        melanoma   COPD Maternal Grandfather    Mental illness Paternal Grandmother     Social History Social History   Tobacco Use   Smoking status: Never   Smokeless tobacco: Never  Substance Use Topics   Alcohol use: No   Drug use: No    No Known Allergies  Current Outpatient Medications  Medication Sig Dispense Refill   Multiple Vitamin (MULTIVITAMIN) tablet Take 1 tablet by mouth daily.     No current facility-administered medications for this visit.    Review of Systems Review of Systems  Gastrointestinal:  Positive for abdominal distention and nausea.  Genitourinary:  Positive for menstrual problem and pelvic pain.    Blood pressure 128/88, pulse (!) 106, height '5\' 7"'$  (1.702 m), weight 170 lb (77.1 kg), last menstrual period 11/11/2022.  Physical Exam Physical Exam Constitutional:      Appearance: Normal appearance. She is not ill-appearing.  Cardiovascular:     Rate and Rhythm: Normal rate.  Pulmonary:     Effort: Pulmonary effort is normal.  Skin:    General: Skin is warm and dry.  Neurological:     Mental Status: She is alert.  Psychiatric:        Mood and Affect: Mood normal.        Behavior: Behavior normal.     Data Reviewed CLINICAL  DATA: Symptomatic uterine fibroids. Treatment planning.  EXAM: MRI PELVIS WITHOUT AND WITH CONTRAST  TECHNIQUE: Multiplanar multisequence MR imaging of the pelvis was performed both before and after administration of intravenous contrast.  CONTRAST: 6.57m GADAVIST GADOBUTROL 1 MMOL/ML IV SOLN  COMPARISON: None.  FINDINGS: Lower Urinary Tract: No bladder or urethral abnormality identified.  Bowel: Unremarkable visualized pelvic bowel loops.  Vascular/Lymphatic: No pathologically enlarged lymph nodes or other significant abnormality.  Reproductive:  -- Uterus: Measures 17.2 x 11.7 x 13.9 cm (volume = 1460 cm^3). A single central uterine fibroid is seen which displaces the endometrial cavity to the right. This is intramural and submucosal in location, measuring 13.4 x 10.3 x 11.7 cm. No other uterine fibroids identified.  -- Intracavitary fibroids: None.  -- Pedunculated fibroids: None.  -- Fibroid contrast enhancement: The fibroid shows diffuse, heterogeneous contrast enhancement, with some small central areas of degeneration.  -- Right ovary: Appears normal. No mass identified.  -- Left ovary: Appears normal. No mass identified.  Other: No abnormal free fluid.  Musculoskeletal: Unremarkable.  IMPRESSION: Single 13.4 cm central uterine fibroid, which is intramural and submucosal in location. No intracavitary or pedunculated fibroids identified.  Normal appearance of both ovaries. No adnexal mass identified.   Electronically Signed By: JMarlaine HindM.D. On:  02/10/2021 08:28   Assessment Large fibroid uterus symptomatic [place] or myomectomy candidate Plan She will need an in-network referral from her PCP, whom she will see in 4 weeks.  25 minutes face to face and documentation and review  Emeterio Reeve 12/01/2022, 11:14 AM
